# Patient Record
Sex: Female | Born: 1955
Health system: Southern US, Community
[De-identification: ages and names within clinical notes are randomized; demographics above are authoritative.]

---

## 1998-05-03 ENCOUNTER — Other Ambulatory Visit: Admission: RE | Admit: 1998-05-03 | Discharge: 1998-05-03 | Payer: Self-pay | Admitting: Obstetrics and Gynecology

## 1999-06-21 ENCOUNTER — Other Ambulatory Visit: Admission: RE | Admit: 1999-06-21 | Discharge: 1999-06-21 | Payer: Self-pay | Admitting: Obstetrics and Gynecology

## 1999-09-07 ENCOUNTER — Encounter: Payer: Self-pay | Admitting: Obstetrics and Gynecology

## 1999-09-07 ENCOUNTER — Encounter: Admission: RE | Admit: 1999-09-07 | Discharge: 1999-09-07 | Payer: Self-pay | Admitting: Obstetrics and Gynecology

## 2000-06-28 ENCOUNTER — Other Ambulatory Visit: Admission: RE | Admit: 2000-06-28 | Discharge: 2000-06-28 | Payer: Self-pay | Admitting: Obstetrics and Gynecology

## 2000-11-06 ENCOUNTER — Encounter: Payer: Self-pay | Admitting: Internal Medicine

## 2000-11-06 ENCOUNTER — Ambulatory Visit (HOSPITAL_COMMUNITY): Admission: RE | Admit: 2000-11-06 | Discharge: 2000-11-06 | Payer: Self-pay | Admitting: Internal Medicine

## 2001-09-18 ENCOUNTER — Other Ambulatory Visit: Admission: RE | Admit: 2001-09-18 | Discharge: 2001-09-18 | Payer: Self-pay | Admitting: Obstetrics and Gynecology

## 2001-10-02 ENCOUNTER — Encounter: Admission: RE | Admit: 2001-10-02 | Discharge: 2001-10-02 | Payer: Self-pay | Admitting: Obstetrics and Gynecology

## 2001-10-02 ENCOUNTER — Encounter: Payer: Self-pay | Admitting: Obstetrics and Gynecology

## 2002-11-11 ENCOUNTER — Other Ambulatory Visit: Admission: RE | Admit: 2002-11-11 | Discharge: 2002-11-11 | Payer: Self-pay | Admitting: Obstetrics and Gynecology

## 2003-03-03 ENCOUNTER — Encounter: Admission: RE | Admit: 2003-03-03 | Discharge: 2003-03-03 | Payer: Self-pay | Admitting: Obstetrics and Gynecology

## 2005-03-07 ENCOUNTER — Other Ambulatory Visit: Admission: RE | Admit: 2005-03-07 | Discharge: 2005-03-07 | Payer: Self-pay | Admitting: Internal Medicine

## 2005-03-15 ENCOUNTER — Encounter: Admission: RE | Admit: 2005-03-15 | Discharge: 2005-03-15 | Payer: Self-pay | Admitting: Internal Medicine

## 2005-11-01 ENCOUNTER — Encounter: Admission: RE | Admit: 2005-11-01 | Discharge: 2005-11-01 | Payer: Self-pay | Admitting: Internal Medicine

## 2006-05-15 ENCOUNTER — Other Ambulatory Visit: Admission: RE | Admit: 2006-05-15 | Discharge: 2006-05-15 | Payer: Self-pay | Admitting: Internal Medicine

## 2006-10-17 ENCOUNTER — Encounter: Admission: RE | Admit: 2006-10-17 | Discharge: 2006-10-17 | Payer: Self-pay | Admitting: Internal Medicine

## 2007-09-26 ENCOUNTER — Other Ambulatory Visit: Admission: RE | Admit: 2007-09-26 | Discharge: 2007-09-26 | Payer: Self-pay | Admitting: Internal Medicine

## 2007-12-17 ENCOUNTER — Ambulatory Visit: Payer: Self-pay | Admitting: Internal Medicine

## 2008-08-10 ENCOUNTER — Ambulatory Visit: Payer: Self-pay | Admitting: Internal Medicine

## 2009-03-22 ENCOUNTER — Ambulatory Visit: Payer: Self-pay | Admitting: Internal Medicine

## 2009-11-10 ENCOUNTER — Encounter: Admission: RE | Admit: 2009-11-10 | Discharge: 2009-11-10 | Payer: Self-pay | Admitting: Internal Medicine

## 2010-03-28 DIAGNOSIS — K529 Noninfective gastroenteritis and colitis, unspecified: Secondary | ICD-10-CM

## 2010-11-14 ENCOUNTER — Encounter: Payer: Self-pay | Admitting: Internal Medicine

## 2011-01-25 ENCOUNTER — Telehealth: Payer: Self-pay

## 2011-01-25 NOTE — Telephone Encounter (Signed)
Can see her tomorrow

## 2011-01-25 NOTE — Telephone Encounter (Signed)
Patient has a daughter in from college that has a breast lump, present for at least 3 months. Has no PCP or Gyn.Has not gone to student center. Wants advice.

## 2011-07-22 ENCOUNTER — Other Ambulatory Visit: Payer: Self-pay | Admitting: Internal Medicine

## 2011-08-15 ENCOUNTER — Ambulatory Visit
Admission: RE | Admit: 2011-08-15 | Discharge: 2011-08-15 | Disposition: A | Discharge status: Home / Self Care | Payer: BC Managed Care – PPO | Source: Ambulatory Visit | Attending: Internal Medicine | Admitting: Internal Medicine

## 2011-08-15 ENCOUNTER — Other Ambulatory Visit: Payer: Self-pay | Admitting: Internal Medicine

## 2011-08-15 DIAGNOSIS — Z1231 Encounter for screening mammogram for malignant neoplasm of breast: Secondary | ICD-10-CM

## 2012-10-11 ENCOUNTER — Other Ambulatory Visit: Payer: 59 | Admitting: Internal Medicine

## 2012-10-11 DIAGNOSIS — Z13220 Encounter for screening for lipoid disorders: Secondary | ICD-10-CM

## 2012-10-11 DIAGNOSIS — Z130 Encounter for screening for diseases of the blood and blood-forming organs and certain disorders involving the immune mechanism: Secondary | ICD-10-CM

## 2012-10-11 DIAGNOSIS — Z0000 Encounter for general adult medical examination without abnormal findings: Secondary | ICD-10-CM

## 2012-10-11 DIAGNOSIS — Z1329 Encounter for screening for other suspected endocrine disorder: Secondary | ICD-10-CM

## 2012-10-11 LAB — COMPREHENSIVE METABOLIC PANEL
ALT: 22 U/L (ref 0–35)
AST: 24 U/L (ref 0–37)
Albumin: 4.3 g/dL (ref 3.5–5.2)
Alkaline Phosphatase: 76 U/L (ref 39–117)
Calcium: 9.2 mg/dL (ref 8.4–10.5)
Creat: 0.75 mg/dL (ref 0.50–1.10)
Glucose, Bld: 83 mg/dL (ref 70–99)
Total Protein: 6.8 g/dL (ref 6.0–8.3)

## 2012-10-11 LAB — VITAMIN D 25 HYDROXY (VIT D DEFICIENCY, FRACTURES): Vit D, 25-Hydroxy: 58 ng/mL (ref 30–89)

## 2012-10-11 LAB — CBC WITH DIFFERENTIAL/PLATELET
HCT: 41.9 % (ref 36.0–46.0)
Hemoglobin: 14.4 g/dL (ref 12.0–15.0)
Lymphocytes Relative: 31 % (ref 12–46)
MCHC: 34.4 g/dL (ref 30.0–36.0)
Neutrophils Relative %: 57 % (ref 43–77)
Platelets: 237 10*3/uL (ref 150–400)
RBC: 4.16 MIL/uL (ref 3.87–5.11)
RDW: 13.8 % (ref 11.5–15.5)
WBC: 4.1 10*3/uL (ref 4.0–10.5)

## 2012-10-11 LAB — LIPID PANEL
HDL: 96 mg/dL (ref >39)
LDL Cholesterol: 94 mg/dL (ref 0–99)
Triglycerides: 72 mg/dL (ref <150)

## 2012-10-14 ENCOUNTER — Ambulatory Visit (INDEPENDENT_AMBULATORY_CARE_PROVIDER_SITE_OTHER): Payer: 59 | Admitting: Internal Medicine

## 2012-10-14 ENCOUNTER — Other Ambulatory Visit (HOSPITAL_COMMUNITY)
Admission: RE | Admit: 2012-10-14 | Discharge: 2012-10-14 | Disposition: A | Discharge status: Home / Self Care | Payer: 59 | Source: Ambulatory Visit | Attending: Internal Medicine | Admitting: Internal Medicine

## 2012-10-14 ENCOUNTER — Encounter: Payer: Self-pay | Admitting: Internal Medicine

## 2012-10-14 VITALS — BP 114/74 | HR 76 | Ht 65.5 in | Wt 138.0 lb

## 2012-10-14 DIAGNOSIS — Z0000 Encounter for general adult medical examination without abnormal findings: Secondary | ICD-10-CM

## 2012-10-14 DIAGNOSIS — Z8659 Personal history of other mental and behavioral disorders: Secondary | ICD-10-CM

## 2012-10-14 DIAGNOSIS — Z01419 Encounter for gynecological examination (general) (routine) without abnormal findings: Secondary | ICD-10-CM | POA: Insufficient documentation

## 2012-10-14 LAB — POCT URINALYSIS DIPSTICK
Bilirubin, UA: NEGATIVE
Ketones, UA: NEGATIVE
Leukocytes, UA: NEGATIVE
Nitrite, UA: NEGATIVE
Urobilinogen, UA: NEGATIVE
pH, UA: 6

## 2012-10-14 MED ORDER — CLONAZEPAM 1 MG PO TABS: 1.0000 mg | ORAL_TABLET | Freq: Every evening | ORAL | Status: DC | PRN

## 2012-10-14 MED ORDER — ESCITALOPRAM OXALATE 10 MG PO TABS: ORAL_TABLET | ORAL | Status: DC

## 2012-10-24 DIAGNOSIS — F32A Depression, unspecified: Secondary | ICD-10-CM | POA: Insufficient documentation

## 2012-10-24 DIAGNOSIS — F329 Major depressive disorder, single episode, unspecified: Secondary | ICD-10-CM | POA: Insufficient documentation

## 2012-10-24 NOTE — Patient Instructions (Addendum)
Continue Klonopin as needed for anxiety and continued daily Lexapro. Return in one year. Have mammogram.

## 2012-10-24 NOTE — Progress Notes (Signed)
  Subjective:    Patient ID: Kara Blake, female    DOB: [birth year], 57 y.o.   MRN: [id number]  HPI 57 year old white female not seen in office since 2011 in today for health maintenance and evaluation of medical issues.  No known drug allergies  History of depression 2007 treated with Lexapro. History of anxiety treated with Klonopin and subsequently Xanax.  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 Does not smoke. Social alcohol consumption. 3 daughters.  Family history: Father died at age 12 of complications of colorectal cancer. Mother died of COPD heart disease with history of heavy smoking. No brothers. One sister in good health.  Patient had colonoscopy 04/26/2006 and [facility] by Dr. Guadalupe Maple. A 1 cm sessile polyp was cold biopsied x6.  History of low back pain seen by [facility] March 2011 and felt to have musculoskeletal pain rather than radiculopathy.  Patient had EKG for valuation of chest pain in 2007 which was normal.    Review of Systems  Constitutional: Negative.   All other systems reviewed and are negative.       Objective:   Physical Exam  Vitals reviewed. Constitutional: She is oriented to person, place, and time. She appears well-developed and well-nourished. No distress.  HENT:  Head: Normocephalic and atraumatic.  Right Ear: External ear normal.  Left Ear: External ear normal.  Mouth/Throat: Oropharynx is clear and moist. No oropharyngeal exudate.  Eyes: Conjunctivae and EOM are normal. Pupils are equal, round, and reactive to light. Right eye exhibits no discharge. Left eye exhibits no discharge. No scleral icterus.  Neck: Neck supple. No JVD present. No thyromegaly present.  Cardiovascular: Normal rate, regular rhythm, normal heart sounds and intact distal pulses.   No murmur heard. Pulmonary/Chest: Effort normal and  breath sounds normal. No respiratory distress. She has no wheezes. She has no rales. She exhibits no tenderness.  Abdominal: Soft. Bowel sounds are normal. She exhibits no distension and no mass. There is no tenderness. There is no rebound and no guarding.  Genitourinary:  Pap taken- no masses on bimanual exam  Musculoskeletal: Normal range of motion. She exhibits no edema.  Lymphadenopathy:    She has no cervical adenopathy.  Neurological: She is oriented to person, place, and time. She has normal reflexes. No cranial nerve deficit. Coordination normal.  Skin: Skin is warm and dry. No rash noted. She is not diaphoretic.  Psychiatric: She has a normal mood and affect. Her behavior is normal. Judgment and thought content normal.          Assessment & Plan:  Normal health maintenance exam  History of anxiety and depression  Plan: Return in one year or as needed. Patient is to check with [facility] to see when next colonoscopy is due. Has history of colon cancer in first degree relative. Lab work is within normal limits including TSH and vitamin D level. Lipid panel normal. Patient is to have annual mammogram.

## 2013-02-14 ENCOUNTER — Emergency Department (HOSPITAL_COMMUNITY)
Admission: EM | Admit: 2013-02-14 | Discharge: 2013-02-14 | Disposition: A | Discharge status: Home / Self Care | Payer: 59 | Attending: Emergency Medicine | Admitting: Emergency Medicine

## 2013-02-14 ENCOUNTER — Encounter (HOSPITAL_COMMUNITY): Payer: Self-pay | Admitting: Emergency Medicine

## 2013-02-14 ENCOUNTER — Emergency Department (HOSPITAL_COMMUNITY): Payer: 59

## 2013-02-14 ENCOUNTER — Emergency Department (INDEPENDENT_AMBULATORY_CARE_PROVIDER_SITE_OTHER)
Admission: EM | Admit: 2013-02-14 | Discharge: 2013-02-14 | Disposition: A | Discharge status: Other Acute Inpatient Hospital | Payer: 59 | Source: Home / Self Care

## 2013-02-14 DIAGNOSIS — R079 Chest pain, unspecified: Secondary | ICD-10-CM | POA: Insufficient documentation

## 2013-02-14 DIAGNOSIS — R000 Tachycardia, unspecified: Secondary | ICD-10-CM

## 2013-02-14 DIAGNOSIS — R209 Unspecified disturbances of skin sensation: Secondary | ICD-10-CM | POA: Insufficient documentation

## 2013-02-14 DIAGNOSIS — R0600 Dyspnea, unspecified: Secondary | ICD-10-CM

## 2013-02-14 DIAGNOSIS — Z7982 Long term (current) use of aspirin: Secondary | ICD-10-CM | POA: Insufficient documentation

## 2013-02-14 DIAGNOSIS — R0989 Other specified symptoms and signs involving the circulatory and respiratory systems: Secondary | ICD-10-CM

## 2013-02-14 DIAGNOSIS — R0602 Shortness of breath: Secondary | ICD-10-CM | POA: Insufficient documentation

## 2013-02-14 DIAGNOSIS — R0609 Other forms of dyspnea: Secondary | ICD-10-CM

## 2013-02-14 DIAGNOSIS — Z79899 Other long term (current) drug therapy: Secondary | ICD-10-CM | POA: Insufficient documentation

## 2013-02-14 LAB — BASIC METABOLIC PANEL
BUN: 10 mg/dL (ref 6–23)
CALCIUM: 9.1 mg/dL (ref 8.4–10.5)
CO2: 21 meq/L (ref 19–32)
CREATININE: 0.58 mg/dL (ref 0.50–1.10)
Chloride: 104 mEq/L (ref 96–112)
Glucose, Bld: 109 mg/dL — ABNORMAL HIGH (ref 70–99)
Potassium: 4.8 mEq/L (ref 3.7–5.3)
SODIUM: 140 meq/L (ref 137–147)

## 2013-02-14 LAB — CBC WITH DIFFERENTIAL/PLATELET
Basophils Absolute: 0 10*3/uL (ref 0.0–0.1)
Basophils Relative: 0 % (ref 0–1)
EOS PCT: 1 % (ref 0–5)
Eosinophils Absolute: 0 10*3/uL (ref 0.0–0.7)
HEMATOCRIT: 39.2 % (ref 36.0–46.0)
Hemoglobin: 13.9 g/dL (ref 12.0–15.0)
Lymphocytes Relative: 25 % (ref 12–46)
Lymphs Abs: 1 10*3/uL (ref 0.7–4.0)
MCH: 35.4 pg — AB (ref 26.0–34.0)
MCHC: 35.5 g/dL (ref 30.0–36.0)
MCV: 99.7 fL (ref 78.0–100.0)
MONO ABS: 0.4 10*3/uL (ref 0.1–1.0)
MONOS PCT: 9 % (ref 3–12)
Neutro Abs: 2.6 10*3/uL (ref 1.7–7.7)
Neutrophils Relative %: 65 % (ref 43–77)
PLATELETS: 207 10*3/uL (ref 150–400)
RBC: 3.93 MIL/uL (ref 3.87–5.11)
RDW: 12.5 % (ref 11.5–15.5)
WBC: 4 10*3/uL (ref 4.0–10.5)

## 2013-02-14 LAB — TROPONIN I: Troponin I: <0.3 ng/mL (ref <0.30)

## 2013-02-14 LAB — D-DIMER, QUANTITATIVE (NOT AT ARMC)

## 2013-02-14 MED ORDER — SODIUM CHLORIDE 0.9 % IV SOLN
Freq: Once | INTRAVENOUS | Status: AC
  Administered 2013-02-14: 11:00:00 via INTRAVENOUS

## 2013-02-14 MED ORDER — ASPIRIN 81 MG PO CHEW
324.0000 mg | CHEWABLE_TABLET | Freq: Once | ORAL | Status: AC
  Administered 2013-02-14: 243 mg via ORAL
  Filled 2013-02-14: qty 4

## 2013-02-14 MED ORDER — NITROGLYCERIN 0.4 MG SL SUBL
SUBLINGUAL_TABLET | SUBLINGUAL | Status: AC
  Filled 2013-02-14: qty 25

## 2013-02-14 MED ORDER — NITROGLYCERIN 0.4 MG SL SUBL
0.4000 mg | SUBLINGUAL_TABLET | SUBLINGUAL | Status: DC | PRN
  Administered 2013-02-14: 0.4 mg via SUBLINGUAL

## 2013-02-14 NOTE — ED Notes (Signed)
CN advised of pending transfer, [facility] Called to provide trnsport

## 2013-02-14 NOTE — ED Notes (Signed)
Woke this AM w numbness left arm and hand, thought it was related to sleep position. While at work ,developed tightness in chest, dyspnea when went up steps. Pain not reproducible w inspiraton, direct pressure on chest. C/o feels as if she cannot get a good deep breath. C/o hands feel sweaty . Skin w/d/color good

## 2013-02-14 NOTE — ED Notes (Signed)
Blood collected by this RN for troponin.

## 2013-02-14 NOTE — ED Provider Notes (Signed)
CSN: [id number]     Arrival date & time 02/14/13  1141 History   First MD Initiated Contact with Patient 02/14/13 1143     No chief complaint on file.  (Consider location/radiation/quality/duration/timing/severity/associated sxs/prior Treatment) The history is provided by the patient.   Kara Blake is a 58 y.o. female who presents for evaluation of left arm numbness, dyspnea on exertion, and chest discomfort. She first noticed a sensation of left arm numbness upon awakening this morning. Later, she developed shortness of breath while walking up stairs in her home. After that, she is trying to a meeting when she noticed a sensation of discomfort in her left lower chest. The chest discomfort was very mild and persisted until she was treated with nitroglycerin, at the urgent care Center. The discomfort has not recurred. The shortness of breath is only with exertion. She continues to have a sensation of left arm numbness, but it has improved. She has never had each of the individual, or the constellation of, these symptoms previously. She is very healthy. She exercises almost daily, playing tennis and doing fitness walking. She drove to the urgent care office today, and was transferred here by [facility]. There are no other known modifying factors.  History reviewed. No pertinent past medical history. Past Surgical History  Procedure Laterality Date  . Cesarean section     History reviewed. No pertinent family history. History  Substance Use Topics  . Smoking status: Never Smoker   . Smokeless tobacco: Never Used  . Alcohol Use: 5.0 oz/week    10 drink(s) per week   OB History   Grav Para Term Preterm Abortions TAB SAB Ect Mult Living                 Review of Systems  All other systems reviewed and are negative.    Allergies  Review of patient's allergies indicates no known allergies.  Home Medications   Current Outpatient Rx  Name  Route  Sig  Dispense  Refill  . aspirin 81  MG tablet   Oral   Take 81 mg by mouth daily.         . Cholecalciferol (VITAMIN D PO)   Oral   Take 1 capsule by mouth daily.         . clonazePAM (KLONOPIN) 1 MG tablet   Oral   Take 1 tablet (1 mg total) by mouth at bedtime as needed for anxiety.   30 tablet   3   . Cyanocobalamin (VITAMIN B12 PO)   Oral   Take 1 capsule by mouth daily.         Marland Kitchen escitalopram (LEXAPRO) 10 MG tablet   Oral   Take 10 mg by mouth daily.          BP 127/75  Pulse 72  Temp(Src) 97.7 F (36.5 C) (Oral)  Resp 20  Ht 5\' 6"  (1.676 m)  Wt 135 lb (61.236 kg)  BMI 21.80 kg/m2  SpO2 98% Physical Exam  Nursing note and vitals reviewed. Constitutional: She is oriented to person, place, and time. She appears well-developed and well-nourished. No distress.  HENT:  Head: Normocephalic and atraumatic.  Eyes: Conjunctivae and EOM are normal. Pupils are equal, round, and reactive to light.  Neck: Normal range of motion and phonation normal. Neck supple.  Cardiovascular: Normal rate, regular rhythm and intact distal pulses.   Pulmonary/Chest: Effort normal and breath sounds normal. She exhibits no tenderness.  Abdominal: Soft. She exhibits no distension. There  is no tenderness. There is no guarding.  Musculoskeletal: Normal range of motion.  Neurological: She is alert and oriented to person, place, and time. She exhibits normal muscle tone.  Skin: Skin is warm and dry.  Psychiatric: She has a normal mood and affect. Her behavior is normal. Judgment and thought content normal.    ED Course  Procedures (including critical care time) Medications  aspirin chewable tablet 324 mg (243 mg Oral Given 02/14/13 1249)    Patient Vitals for the past 24 hrs:  BP Temp Temp src Pulse Resp SpO2 Height Weight  02/14/13 1552 127/75 mmHg - - 72 20 98 % - -  02/14/13 1445 119/90 mmHg - - 69 17 98 % - -  02/14/13 1429 115/61 mmHg - - 79 20 98 % - -  02/14/13 1351 117/60 mmHg - - 86 18 98 % - -  02/14/13  1251 119/65 mmHg - - 91 20 95 % - -  02/14/13 1237 115/70 mmHg - - 82 20 97 % - -  02/14/13 1148 110/73 mmHg 97.7 F (36.5 C) Oral 93 18 98 % 5\' 6"  (1.676 m) 135 lb (61.236 kg)    1:55 PM Reevaluation with update and discussion. After initial assessment and treatment, an updated evaluation reveals all symptoms resolved. No further complaints. Keghan Mcfarren L     Labs Review Labs Reviewed  CBC WITH DIFFERENTIAL - Abnormal; Notable for the following:    MCH 35.4 (*)    All other components within normal limits  BASIC METABOLIC PANEL - Abnormal; Notable for the following:    Glucose, Bld 109 (*)    All other components within normal limits  TROPONIN I  D-DIMER, QUANTITATIVE  TROPONIN I   Imaging Review Dg Chest 2 View  02/14/2013   CLINICAL DATA:  Left arm numbness, severe shortness of breath  EXAM: CHEST  2 VIEW  COMPARISON:  None.  FINDINGS: Normal cardiac silhouette and mediastinal contours. Suspected minimal amount of atherosclerotic plaque within the thoracic aorta. The lungs are hyperexpanded with mild diffuse slightly nodular thickening of the pulmonary interstitium. No focal airspace opacities. No pleural effusion or pneumothorax. No evidence of edema. No acute osseus abnormalities.  IMPRESSION: Hyperexpanded lungs and bronchitic change without acute cardiopulmonary disease.   Electronically Signed   By: Sandi Mariscal M.D.   On: 02/14/2013 15:25    EKG Interpretation    Date/Time:  Friday February 14 2013 11:48:50 EST Ventricular Rate:  78 PR Interval:  156 QRS Duration: 68 QT Interval:  369 QTC Calculation: 420 R Axis:   73 Text Interpretation:  Sinus rhythm Probable left atrial enlargement Anteroseptal infarct, old since last tracing no significant change Confirmed by Eulis Foster  MD, Anett Ranker ([id number]) on 02/14/2013 12:42:27 PM            MDM   1. Chest pain    Nonspecific chest pain, with reassuring evaluation. Negative delta troponin, and screening labs. Doubt ACS, PE,  or pneumonia. She is at low risk for cardiac disease.  Nursing Notes Reviewed/ Care Coordinated Applicable Imaging Reviewed Interpretation of Laboratory Data incorporated into ED treatment  The patient appears reasonably screened and/or stabilized for discharge and I doubt any other medical condition or other [facility] requiring further screening, evaluation, or treatment in the ED at this time prior to discharge.  Plan: Home Medications- usual; Home Treatments- rest; return here if the recommended treatment, does not improve the symptoms; Recommended follow up- PCP 1 week for stress test   Vira Agar  Adolph Pollack, MD 02/14/13 1620

## 2013-02-14 NOTE — ED Provider Notes (Signed)
CSN: [id number]     Arrival date & time 02/14/13  1006 History   First MD Initiated Contact with Patient 02/14/13 1042     Chief Complaint  Patient presents with  . Chest Pain   (Consider location/radiation/quality/duration/timing/severity/associated sxs/prior Treatment) HPI Comments: As above, this 58 year old female was at work this morning and experienced acute onset of tightness in the chest also described as a fullness or a bubble subsequently. When she began to climb steps she also experienced exertional dyspnea which is unusual for her. She developed diaphoresis and clamminess and felt as though she could not get a deep breath. On arrival she is awake and alert and in no acute distress. She rates her chest pain as a 4. She states she is in generally good health N. waltz about 2-1/2 miles nearly every day. Her only medication is aspirin 81 mg daily and Lexapro.    History reviewed. No pertinent past medical history. History reviewed. No pertinent past surgical history. No family history on file. History  Substance Use Topics  . Smoking status: Never Smoker   . Smokeless tobacco: Never Used  . Alcohol Use: 5.0 oz/week    10 drink(s) per week   OB History   Grav Para Term Preterm Abortions TAB SAB Ect Mult Living                 Review of Systems  Constitutional: Positive for activity change. Negative for fever.  HENT: Negative.   Respiratory: Positive for chest tightness and shortness of breath. Negative for cough and wheezing.   Cardiovascular: Positive for chest pain. Negative for leg swelling.  Gastrointestinal: Negative.   Genitourinary: Negative.   Skin: Negative for rash.  Psychiatric/Behavioral: The patient is nervous/anxious.     Allergies  Review of patient's allergies indicates no known allergies.  Home Medications   Current Outpatient Rx  Name  Route  Sig  Dispense  Refill  . aspirin 81 MG tablet   Oral   Take 81 mg by mouth daily.         .  clonazePAM (KLONOPIN) 1 MG tablet   Oral   Take 1 tablet (1 mg total) by mouth at bedtime as needed for anxiety.   30 tablet   3   . escitalopram (LEXAPRO) 10 MG tablet      TAKE 1 TABLET BY MOUTH EVERY DAY   90 tablet   PRN    BP 123/84  Pulse 109  Temp(Src) 97.4 F (36.3 C) (Oral)  Resp 20  SpO2 99% Physical Exam  Nursing note and vitals reviewed. Constitutional: She is oriented to person, place, and time. She appears well-developed and well-nourished. No distress.  Eyes: Conjunctivae and EOM are normal.  Neck: Normal range of motion. Neck supple.  Cardiovascular: Normal heart sounds.   tachycardia  Pulmonary/Chest: Effort normal and breath sounds normal. She has no wheezes.  Intermittent times of hyperventilation.  Abdominal: Soft. There is no tenderness.  Musculoskeletal: She exhibits no edema and no tenderness.  Neurological: She is alert and oriented to person, place, and time. She exhibits normal muscle tone.  Skin: Skin is warm and dry. No rash noted.  Psychiatric: She has a normal mood and affect.    ED Course  Procedures (including critical care time) Labs Review Labs Reviewed - No data to display Imaging Review No results found. EKG : sinus tachycardia. NSR/sinus arrythmia.   MDM   1. Chest pain   2. Dyspnea   3. Tachycardia  Transfer to [facility] ED for evaluation of acute chest pain and dyspnea that began suddenly this AM Improved chest discomfort post NTG x 1. From 4/10 to 1/10. BP did drop to 030 systolic and she was placed flat/supine.    Janne Napoleon, NP 02/14/13 1115  Janne Napoleon, NP 02/14/13 1115

## 2013-02-14 NOTE — ED Provider Notes (Signed)
Medical screening examination/treatment/procedure(s) were performed by resident physician or non-physician practitioner and as supervising physician I was immediately available for consultation/collaboration.   Pauline Good MD.   Billy Fischer, MD 02/14/13 [phone]

## 2013-02-14 NOTE — Discharge Instructions (Signed)
Chest Pain (Nonspecific) °It is often hard to give a specific diagnosis for the cause of chest pain. There is always a chance that your pain could be related to something serious, such as a heart attack or a blood clot in the lungs. You need to follow up with your caregiver for further evaluation. °CAUSES  °· Heartburn. °· Pneumonia or bronchitis. °· Anxiety or stress. °· Inflammation around your heart (pericarditis) or lung (pleuritis or pleurisy). °· A blood clot in the lung. °· A collapsed lung (pneumothorax). It can develop suddenly on its own (spontaneous pneumothorax) or from injury (trauma) to the chest. °· Shingles infection (herpes zoster virus). °The chest wall is composed of bones, muscles, and cartilage. Any of these can be the source of the pain. °· The bones can be bruised by injury. °· The muscles or cartilage can be strained by coughing or overwork. °· The cartilage can be affected by inflammation and become sore (costochondritis). °DIAGNOSIS  °Lab tests or other studies, such as X-rays, electrocardiography, stress testing, or cardiac imaging, may be needed to find the cause of your pain.  °TREATMENT  °· Treatment depends on what may be causing your chest pain. Treatment may include: °· Acid blockers for heartburn. °· Anti-inflammatory medicine. °· Pain medicine for inflammatory conditions. °· Antibiotics if an infection is present. °· You may be advised to change lifestyle habits. This includes stopping smoking and avoiding alcohol, caffeine, and chocolate. °· You may be advised to keep your head raised (elevated) when sleeping. This reduces the chance of acid going backward from your stomach into your esophagus. °· Most of the time, nonspecific chest pain will improve within 2 to 3 days with rest and mild pain medicine. °HOME CARE INSTRUCTIONS  °· If antibiotics were prescribed, take your antibiotics as directed. Finish them even if you start to feel better. °· For the next few days, avoid physical  activities that bring on chest pain. Continue physical activities as directed. °· Do not smoke. °· Avoid drinking alcohol. °· Only take over-the-counter or prescription medicine for pain, discomfort, or fever as directed by your caregiver. °· Follow your caregiver's suggestions for further testing if your chest pain does not go away. °· Keep any follow-up appointments you made. If you do not go to an appointment, you could develop lasting (chronic) problems with pain. If there is any problem keeping an appointment, you must call to reschedule. °SEEK MEDICAL CARE IF:  °· You think you are having problems from the medicine you are taking. Read your medicine instructions carefully. °· Your chest pain does not go away, even after treatment. °· You develop a rash with blisters on your chest. °SEEK IMMEDIATE MEDICAL CARE IF:  °· You have increased chest pain or pain that spreads to your arm, neck, jaw, back, or abdomen. °· You develop shortness of breath, an increasing cough, or you are coughing up blood. °· You have severe back or abdominal pain, feel nauseous, or vomit. °· You develop severe weakness, fainting, or chills. °· You have a fever. °THIS IS AN EMERGENCY. Do not wait to see if the pain will go away. Get medical help at once. Call your local emergency services (911 in [place] Do not drive yourself to the hospital. °MAKE SURE YOU:  °· Understand these instructions. °· Will watch your condition. °· Will get help right away if you are not doing well or get worse. °Document Released: 10/19/2004 Document Revised: 04/03/2011 Document Reviewed: 08/15/2007 °Exit[employer]® Patient Information ©2014 [employer]   [employer]. ° °

## 2013-02-14 NOTE — ED Notes (Signed)
Pt presents with onset of L arm numbness that she noted when she woke up this morning.  Pt reports shortness of breath with exertion.  Pt seen at Urgent Care, was given NTG that relieved sensation.  Pt describes chest discomfort as "indigestion".

## 2013-02-14 NOTE — ED Notes (Signed)
MD at bedside. 

## 2013-05-06 ENCOUNTER — Other Ambulatory Visit: Payer: Self-pay | Admitting: Internal Medicine

## 2013-12-15 ENCOUNTER — Other Ambulatory Visit: Payer: Self-pay | Admitting: Internal Medicine

## 2013-12-15 NOTE — Telephone Encounter (Signed)
Clonazepam called into [facility].

## 2013-12-15 NOTE — Telephone Encounter (Signed)
Please refill x 6 months 

## 2014-01-10 ENCOUNTER — Other Ambulatory Visit: Payer: Self-pay | Admitting: Internal Medicine

## 2015-02-07 ENCOUNTER — Other Ambulatory Visit: Payer: Self-pay | Admitting: Internal Medicine

## 2015-06-03 ENCOUNTER — Other Ambulatory Visit: Payer: 59 | Admitting: Internal Medicine

## 2015-06-03 DIAGNOSIS — Z0000 Encounter for general adult medical examination without abnormal findings: Secondary | ICD-10-CM

## 2015-06-03 DIAGNOSIS — Z1329 Encounter for screening for other suspected endocrine disorder: Secondary | ICD-10-CM

## 2015-06-03 DIAGNOSIS — Z1321 Encounter for screening for nutritional disorder: Secondary | ICD-10-CM

## 2015-06-03 DIAGNOSIS — Z130 Encounter for screening for diseases of the blood and blood-forming organs and certain disorders involving the immune mechanism: Secondary | ICD-10-CM

## 2015-06-03 DIAGNOSIS — Z13220 Encounter for screening for lipoid disorders: Secondary | ICD-10-CM

## 2015-06-03 LAB — COMPLETE METABOLIC PANEL WITH GFR
ALBUMIN: 4.5 g/dL (ref 3.6–5.1)
ALK PHOS: 82 U/L (ref 33–130)
ALT: 18 U/L (ref 6–29)
AST: 24 U/L (ref 10–35)
BUN: 11 mg/dL (ref 7–25)
CALCIUM: 9.4 mg/dL (ref 8.6–10.4)
CHLORIDE: 104 mmol/L (ref 98–110)
CO2: 26 mmol/L (ref 20–31)
Creat: 0.69 mg/dL (ref 0.50–0.99)
GFR, Est African American: >89 mL/min (ref >=60)
Glucose, Bld: 91 mg/dL (ref 65–99)
POTASSIUM: 4.8 mmol/L (ref 3.5–5.3)
Sodium: 139 mmol/L (ref 135–146)
Total Bilirubin: 1.3 mg/dL — ABNORMAL HIGH (ref 0.2–1.2)
Total Protein: 6.7 g/dL (ref 6.1–8.1)

## 2015-06-03 LAB — CBC WITH DIFFERENTIAL/PLATELET
BASOS ABS: 36 {cells}/uL (ref 0–200)
Basophils Relative: 1 %
Eosinophils Absolute: 180 cells/uL (ref 15–500)
Eosinophils Relative: 5 %
HEMATOCRIT: 42.5 % (ref 35.0–45.0)
HEMOGLOBIN: 14.6 g/dL (ref 11.7–15.5)
LYMPHS ABS: 1188 {cells}/uL (ref 850–3900)
Lymphocytes Relative: 33 %
MCH: 34.9 pg — AB (ref 27.0–33.0)
MCHC: 34.4 g/dL (ref 32.0–36.0)
MCV: 101.7 fL — ABNORMAL HIGH (ref 80.0–100.0)
MONO ABS: 288 {cells}/uL (ref 200–950)
MPV: 9.8 fL (ref 7.5–12.5)
Monocytes Relative: 8 %
NEUTROS PCT: 53 %
Neutro Abs: 1908 cells/uL (ref 1500–7800)
Platelets: 238 10*3/uL (ref 140–400)
RBC: 4.18 MIL/uL (ref 3.80–5.10)
RDW: 13.6 % (ref 11.0–15.0)
WBC: 3.6 10*3/uL — ABNORMAL LOW (ref 3.8–10.8)

## 2015-06-03 LAB — LIPID PANEL
CHOL/HDL RATIO: 2.2 ratio (ref <=5.0)
Cholesterol: 216 mg/dL — ABNORMAL HIGH (ref 125–200)
HDL: 98 mg/dL (ref >=46)
LDL Cholesterol: 107 mg/dL (ref <130)
TRIGLYCERIDES: 56 mg/dL (ref <150)
VLDL: 11 mg/dL (ref <30)

## 2015-06-03 LAB — TSH: TSH: 1.34 mIU/L

## 2015-06-04 ENCOUNTER — Other Ambulatory Visit (HOSPITAL_COMMUNITY)
Admission: RE | Admit: 2015-06-04 | Discharge: 2015-06-04 | Disposition: A | Discharge status: Home / Self Care | Payer: 59 | Source: Ambulatory Visit | Attending: Internal Medicine | Admitting: Internal Medicine

## 2015-06-04 ENCOUNTER — Ambulatory Visit (INDEPENDENT_AMBULATORY_CARE_PROVIDER_SITE_OTHER): Payer: 59 | Admitting: Internal Medicine

## 2015-06-04 ENCOUNTER — Encounter: Payer: Self-pay | Admitting: Internal Medicine

## 2015-06-04 VITALS — BP 126/78 | HR 88 | Temp 97.8°F | Resp 18 | Ht 66.0 in | Wt 145.0 lb

## 2015-06-04 DIAGNOSIS — Z01419 Encounter for gynecological examination (general) (routine) without abnormal findings: Secondary | ICD-10-CM | POA: Insufficient documentation

## 2015-06-04 DIAGNOSIS — Z0000 Encounter for general adult medical examination without abnormal findings: Secondary | ICD-10-CM | POA: Diagnosis not present

## 2015-06-04 DIAGNOSIS — Z23 Encounter for immunization: Secondary | ICD-10-CM | POA: Diagnosis not present

## 2015-06-04 DIAGNOSIS — Z124 Encounter for screening for malignant neoplasm of cervix: Secondary | ICD-10-CM | POA: Diagnosis not present

## 2015-06-04 LAB — POCT URINALYSIS DIPSTICK
BILIRUBIN UA: NEGATIVE
Blood, UA: NEGATIVE
Glucose, UA: NEGATIVE
KETONES UA: NEGATIVE
LEUKOCYTES UA: NEGATIVE
Nitrite, UA: NEGATIVE
PH UA: 7
Protein, UA: NEGATIVE
Spec Grav, UA: 1.025
Urobilinogen, UA: 0.2

## 2015-06-04 LAB — VITAMIN D 25 HYDROXY (VIT D DEFICIENCY, FRACTURES): Vit D, 25-Hydroxy: 40 ng/mL (ref 30–100)

## 2015-06-04 NOTE — Patient Instructions (Addendum)
It was a pleasure to see you today. Continue same generic Lexapro and return in one year. Tetanus immunization given today.

## 2015-06-09 LAB — CYTOLOGY - PAP

## 2015-06-21 NOTE — Progress Notes (Signed)
   Subjective:    Patient ID: Kara Blake, female    DOB: 1955/08/22, 60 y.o.   MRN: [id number]  HPI 60 year old Female in today for health maintenance exam and evaluation of medical issues. Last seen here in 2014.  No known drug allergies  History of depression 2007 treated with Lexapro. History of anxiety treated with Klonopin and subsequently changed to Xanax.  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 She does not smoke. Social alcohol consumption. 3 daughters.  Family history: Father died at age 46 of complications of colorectal cancer. Mother died of COPD and heart disease with history of heavy smoking. No brothers. One sister in good health.  Patient had colonoscopy 04/26/2006 in [facility] by gastroenterologist(Dr. Eduard Clos). A 1 cm sessile polyp was cold biopsied 6.  History of low back pain seen at [facility] March 2011 and felt to have musculoskeletal pain rather than radiculopathy.  Patient had EKG for evaluation of chest pain in 2007 which was normal.    Review of Systems  Constitutional: Negative.   All other systems reviewed and are negative.      Objective:   Physical Exam  Constitutional: She is oriented to person, place, and time. She appears well-developed and well-nourished. No distress.  HENT:  Head: Normocephalic and atraumatic.  Right Ear: External ear normal.  Left Ear: External ear normal.  Mouth/Throat: Oropharynx is clear and moist. No oropharyngeal exudate.  Eyes: Conjunctivae and EOM are normal. Pupils are equal, round, and reactive to light. Right eye exhibits no discharge. Left eye exhibits no discharge. No scleral icterus.  Neck: Neck supple. No JVD present. No thyromegaly present.  Cardiovascular: Normal rate, regular rhythm, normal heart sounds and intact distal pulses.   No murmur heard. Pulmonary/Chest: Effort  normal. No respiratory distress. She has no wheezes. She has no rales. She exhibits no tenderness.  Breasts normal female without masses  Abdominal: Soft. Bowel sounds are normal. She exhibits no distension and no mass. There is no tenderness. There is no rebound and no guarding.  Genitourinary:  Pap taken. Bimanual normal  Neurological: She is alert and oriented to person, place, and time. She has normal reflexes. No cranial nerve deficit. Coordination normal.  Skin: Skin is warm and dry. No rash noted. She is not diaphoretic.  Psychiatric: She has a normal mood and affect. Her behavior is normal. Judgment and thought content normal.  Vitals reviewed.         Assessment & Plan:  Normal health maintenance exam  History of anxiety-okay to refill generic Lexapro for one year  Routine health maintenance-tetanus immunization update given today. Pap smear taken with results pending.  Plan: Okay to refill generic Lexapro for one year. Return in one year or as needed. Total cholesterol 216 and previously was normal. Work on diet and exercise.

## 2015-11-16 ENCOUNTER — Other Ambulatory Visit: Payer: Self-pay | Admitting: Internal Medicine

## 2015-11-16 DIAGNOSIS — Z1231 Encounter for screening mammogram for malignant neoplasm of breast: Secondary | ICD-10-CM

## 2015-12-07 ENCOUNTER — Ambulatory Visit: Payer: 59

## 2016-01-06 ENCOUNTER — Ambulatory Visit
Admission: RE | Admit: 2016-01-06 | Discharge: 2016-01-06 | Disposition: A | Discharge status: Home / Self Care | Payer: 59 | Source: Ambulatory Visit | Attending: Internal Medicine | Admitting: Internal Medicine

## 2016-01-06 DIAGNOSIS — Z1231 Encounter for screening mammogram for malignant neoplasm of breast: Secondary | ICD-10-CM

## 2016-05-07 ENCOUNTER — Other Ambulatory Visit: Payer: Self-pay | Admitting: Internal Medicine

## 2016-08-19 ENCOUNTER — Other Ambulatory Visit: Payer: Self-pay | Admitting: Internal Medicine

## 2016-11-29 ENCOUNTER — Other Ambulatory Visit: Payer: Self-pay | Admitting: Internal Medicine

## 2016-11-29 NOTE — Telephone Encounter (Signed)
Please book CPE after May 12

## 2017-03-14 DIAGNOSIS — D122 Benign neoplasm of ascending colon: Secondary | ICD-10-CM | POA: Diagnosis not present

## 2017-03-14 DIAGNOSIS — D124 Benign neoplasm of descending colon: Secondary | ICD-10-CM | POA: Diagnosis not present

## 2017-03-14 DIAGNOSIS — D126 Benign neoplasm of colon, unspecified: Secondary | ICD-10-CM | POA: Diagnosis not present

## 2017-03-14 DIAGNOSIS — Z800 Family history of malignant neoplasm of digestive organs: Secondary | ICD-10-CM | POA: Diagnosis not present

## 2017-03-14 DIAGNOSIS — K648 Other hemorrhoids: Secondary | ICD-10-CM | POA: Diagnosis not present

## 2017-03-14 DIAGNOSIS — Z86010 Personal history of colonic polyps: Secondary | ICD-10-CM | POA: Diagnosis not present

## 2017-03-14 DIAGNOSIS — K5730 Diverticulosis of large intestine without perforation or abscess without bleeding: Secondary | ICD-10-CM | POA: Diagnosis not present

## 2017-06-19 DIAGNOSIS — M5417 Radiculopathy, lumbosacral region: Secondary | ICD-10-CM | POA: Diagnosis not present

## 2017-06-19 DIAGNOSIS — M7912 Myalgia of auxiliary muscles, head and neck: Secondary | ICD-10-CM | POA: Diagnosis not present

## 2017-06-19 DIAGNOSIS — M62830 Muscle spasm of back: Secondary | ICD-10-CM | POA: Diagnosis not present

## 2017-07-16 DIAGNOSIS — H2513 Age-related nuclear cataract, bilateral: Secondary | ICD-10-CM | POA: Diagnosis not present

## 2017-09-03 ENCOUNTER — Other Ambulatory Visit: Payer: Self-pay | Admitting: Internal Medicine

## 2018-03-07 ENCOUNTER — Other Ambulatory Visit: Payer: 59 | Admitting: Internal Medicine

## 2018-03-07 DIAGNOSIS — E785 Hyperlipidemia, unspecified: Secondary | ICD-10-CM

## 2018-03-07 DIAGNOSIS — Z1329 Encounter for screening for other suspected endocrine disorder: Secondary | ICD-10-CM

## 2018-03-07 DIAGNOSIS — Z0000 Encounter for general adult medical examination without abnormal findings: Secondary | ICD-10-CM | POA: Diagnosis not present

## 2018-03-07 DIAGNOSIS — Z13220 Encounter for screening for lipoid disorders: Secondary | ICD-10-CM

## 2018-03-08 LAB — COMPLETE METABOLIC PANEL WITH GFR
AG Ratio: 1.9 (calc) (ref 1.0–2.5)
ALKALINE PHOSPHATASE (APISO): 89 U/L (ref 37–153)
ALT: 24 U/L (ref 6–29)
AST: 26 U/L (ref 10–35)
Albumin: 4.5 g/dL (ref 3.6–5.1)
BUN: 11 mg/dL (ref 7–25)
CALCIUM: 9.6 mg/dL (ref 8.6–10.4)
CO2: 27 mmol/L (ref 20–32)
CREATININE: 0.75 mg/dL (ref 0.50–0.99)
Chloride: 105 mmol/L (ref 98–110)
GFR, EST NON AFRICAN AMERICAN: 85 mL/min/{1.73_m2} (ref >=60)
GFR, Est African American: 98 mL/min/{1.73_m2} (ref >=60)
GLUCOSE: 89 mg/dL (ref 65–99)
Globulin: 2.4 g/dL (calc) (ref 1.9–3.7)
Potassium: 5.1 mmol/L (ref 3.5–5.3)
SODIUM: 140 mmol/L (ref 135–146)
Total Bilirubin: 1.3 mg/dL — ABNORMAL HIGH (ref 0.2–1.2)
Total Protein: 6.9 g/dL (ref 6.1–8.1)

## 2018-03-08 LAB — CBC WITH DIFFERENTIAL/PLATELET
Absolute Monocytes: 401 cells/uL (ref 200–950)
BASOS ABS: 51 {cells}/uL (ref 0–200)
Basophils Relative: 1.5 %
EOS PCT: 3.8 %
Eosinophils Absolute: 129 cells/uL (ref 15–500)
HCT: 42.8 % (ref 35.0–45.0)
Hemoglobin: 14.9 g/dL (ref 11.7–15.5)
Lymphs Abs: 1200 cells/uL (ref 850–3900)
MCH: 35.2 pg — ABNORMAL HIGH (ref 27.0–33.0)
MCHC: 34.8 g/dL (ref 32.0–36.0)
MCV: 101.2 fL — ABNORMAL HIGH (ref 80.0–100.0)
MONOS PCT: 11.8 %
MPV: 9.9 fL (ref 7.5–12.5)
NEUTROS PCT: 47.6 %
Neutro Abs: 1618 cells/uL (ref 1500–7800)
PLATELETS: 266 10*3/uL (ref 140–400)
RBC: 4.23 10*6/uL (ref 3.80–5.10)
RDW: 12.4 % (ref 11.0–15.0)
TOTAL LYMPHOCYTE: 35.3 %
WBC: 3.4 10*3/uL — AB (ref 3.8–10.8)

## 2018-03-08 LAB — LIPID PANEL
CHOL/HDL RATIO: 2.3 (calc) (ref <5.0)
CHOLESTEROL: 240 mg/dL — AB (ref <200)
HDL: 105 mg/dL (ref >=50)
LDL Cholesterol (Calc): 121 mg/dL (calc) — ABNORMAL HIGH
NON-HDL CHOLESTEROL (CALC): 135 mg/dL — AB (ref <130)
TRIGLYCERIDES: 52 mg/dL (ref <150)

## 2018-03-08 LAB — TSH: TSH: 1.61 mIU/L (ref 0.40–4.50)

## 2018-03-08 LAB — VITAMIN D 25 HYDROXY (VIT D DEFICIENCY, FRACTURES): VIT D 25 HYDROXY: 27 ng/mL — AB (ref 30–100)

## 2018-03-11 ENCOUNTER — Ambulatory Visit (INDEPENDENT_AMBULATORY_CARE_PROVIDER_SITE_OTHER): Payer: 59 | Admitting: Internal Medicine

## 2018-03-11 ENCOUNTER — Other Ambulatory Visit (HOSPITAL_COMMUNITY)
Admission: RE | Admit: 2018-03-11 | Discharge: 2018-03-11 | Disposition: A | Discharge status: Home / Self Care | Payer: 59 | Source: Ambulatory Visit | Attending: Internal Medicine | Admitting: Internal Medicine

## 2018-03-11 ENCOUNTER — Encounter: Payer: Self-pay | Admitting: Internal Medicine

## 2018-03-11 VITALS — BP 140/90 | HR 101 | Ht 66.0 in | Wt 145.0 lb

## 2018-03-11 DIAGNOSIS — Z23 Encounter for immunization: Secondary | ICD-10-CM | POA: Diagnosis not present

## 2018-03-11 DIAGNOSIS — E559 Vitamin D deficiency, unspecified: Secondary | ICD-10-CM

## 2018-03-11 DIAGNOSIS — D7589 Other specified diseases of blood and blood-forming organs: Secondary | ICD-10-CM

## 2018-03-11 DIAGNOSIS — Z124 Encounter for screening for malignant neoplasm of cervix: Secondary | ICD-10-CM | POA: Insufficient documentation

## 2018-03-11 DIAGNOSIS — Z0000 Encounter for general adult medical examination without abnormal findings: Secondary | ICD-10-CM

## 2018-03-11 DIAGNOSIS — R5383 Other fatigue: Secondary | ICD-10-CM

## 2018-03-11 DIAGNOSIS — E7800 Pure hypercholesterolemia, unspecified: Secondary | ICD-10-CM

## 2018-03-11 DIAGNOSIS — R7989 Other specified abnormal findings of blood chemistry: Secondary | ICD-10-CM

## 2018-03-11 DIAGNOSIS — R8290 Unspecified abnormal findings in urine: Secondary | ICD-10-CM | POA: Diagnosis not present

## 2018-03-11 LAB — POCT URINALYSIS DIPSTICK
APPEARANCE: NEGATIVE
BILIRUBIN UA: NEGATIVE
Blood, UA: NEGATIVE
Glucose, UA: NEGATIVE
Ketones, UA: NEGATIVE
Nitrite, UA: NEGATIVE
Odor: NEGATIVE
Protein, UA: NEGATIVE
Spec Grav, UA: 1.01 (ref 1.010–1.025)
Urobilinogen, UA: 0.2 E.U./dL
pH, UA: 6.5 (ref 5.0–8.0)

## 2018-03-11 MED ORDER — BUPROPION HCL ER (XL) 150 MG PO TB24: 150.0000 mg | ORAL_TABLET | Freq: Every day | ORAL | 0 refills | Status: DC

## 2018-03-11 NOTE — Progress Notes (Signed)
Subjective:    Patient ID: Kara Blake, female    DOB: 08/03/1955, 63 y.o.   MRN: [id number]  HPI Pleasant 63 year old Female in today for health maintenance exam and evaluation of medical issues.  Last seen in 2017 for health maintenance exam.  Had colonoscopy in [facility] February 2019.  No recent mammogram on file.  Reminded and order placed.  Labs reviewed.  TSH is normal.  Vitamin D level is low at 27 and needs to take 2000 units vitamin D3 daily.  She has significant elevation of total cholesterol at 240 but her HDL is 105 and her LDL is 121.  She does not want to be on statin therapy.  She has an elevated MCV of 101.2.  We checked B12 and folate levels and these were normal.  She has abnormal urine dipstick but culture was negative.  She has some fatigue but has no evidence of iron deficiency.  TSH is normal.  Had breast augmentation in [place] in the 1980s.  C-section x3.  History of depression 2007 treated with Lexapro.  History of anxiety treated with Klonopin and then changed to Xanax.  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  She does not smoke.  Social alcohol consumption.  3 daughters.  Family history: Father died at age 55 of complications of colorectal cancer.  Mother died of COPD and heart disease with history of heavy smoking.  No brothers.  One sister in good health.  Used to take Lexapro for anxiety and depression but she weaned herself off of that.  Still has some fatigue.  Is going to try Wellbutrin 150 mg XL daily.  Review of Systems  Constitutional: Positive for fatigue.  Respiratory: Negative.   Cardiovascular: Negative.   Gastrointestinal: Negative.   Genitourinary: Negative.   All other systems reviewed and are negative.      Objective:   Physical Exam Vitals signs reviewed.  Constitutional:      General: She is not in acute distress.    Appearance: Normal appearance.  HENT:     Head: Normocephalic  and atraumatic.     Right Ear: Tympanic membrane normal.     Left Ear: Tympanic membrane normal.     Nose: Nose normal.     Mouth/Throat:     Mouth: Mucous membranes are moist.     Pharynx: Oropharynx is clear.  Eyes:     General: No scleral icterus.       Right eye: No discharge.        Left eye: No discharge.     Extraocular Movements: Extraocular movements intact.     Conjunctiva/sclera: Conjunctivae normal.     Pupils: Pupils are equal, round, and reactive to light.  Neck:     Musculoskeletal: Neck supple. No neck rigidity.     Vascular: No carotid bruit.  Cardiovascular:     Rate and Rhythm: Normal rate and regular rhythm.     Pulses: Normal pulses.     Heart sounds: Normal heart sounds. No murmur.  Pulmonary:     Effort: Pulmonary effort is normal.     Breath sounds: Normal breath sounds. No wheezing or rales.  Abdominal:     General: Bowel sounds are normal.     Palpations: Abdomen is soft.     Tenderness: There is no abdominal tenderness. There is no guarding or rebound.  Genitourinary:    Comments: Pap taken.  Bimanual normal. Musculoskeletal:  Right lower leg: No edema.     Left lower leg: No edema.  Lymphadenopathy:     Cervical: No cervical adenopathy.  Skin:    General: Skin is warm and dry.     Findings: No rash.  Neurological:     General: No focal deficit present.     Mental Status: She is alert and oriented to person, place, and time.  Psychiatric:        Mood and Affect: Mood normal.        Behavior: Behavior normal.        Thought Content: Thought content normal.        Judgment: Judgment normal.           Assessment & Plan:  Normal health maintenance exam  Vitamin D deficiency-take 2000 units vitamin D3 daily  Fatigue-work-up is negative for vitamin deficiency and hypothyroidism.  Try Wellbutrin XL 150 mg daily.  No longer on Lexapro.  Flu vaccine given.  Please have mammogram.

## 2018-03-12 LAB — IRON,TIBC AND FERRITIN PANEL
%SAT: 36 % (calc) (ref 16–45)
Ferritin: 76 ng/mL (ref 16–288)
Iron: 130 ug/dL (ref 45–160)
TIBC: 363 ug/dL (ref 250–450)

## 2018-03-12 LAB — URINE CULTURE
MICRO NUMBER:: 203779
SPECIMEN QUALITY:: ADEQUATE

## 2018-03-12 LAB — VITAMIN B12: Vitamin B-12: 452 pg/mL (ref 200–1100)

## 2018-03-12 LAB — FOLATE: Folate: 8.7 ng/mL

## 2018-03-13 LAB — CYTOLOGY - PAP
ADEQUACY: ABSENT
DIAGNOSIS: NEGATIVE
HPV: NOT DETECTED

## 2018-03-20 DIAGNOSIS — L249 Irritant contact dermatitis, unspecified cause: Secondary | ICD-10-CM | POA: Diagnosis not present

## 2018-03-23 NOTE — Patient Instructions (Addendum)
It was a pleasure to see you today.  Watch diet.  Recommend repeat lipid panel in 6 months.  Please have mammogram.  Trial of Wellbutrin for fatigue.  Lexapro has been discontinued.

## 2018-04-22 ENCOUNTER — Other Ambulatory Visit: Payer: Self-pay | Admitting: Internal Medicine

## 2018-04-22 DIAGNOSIS — Z1231 Encounter for screening mammogram for malignant neoplasm of breast: Secondary | ICD-10-CM

## 2018-06-18 ENCOUNTER — Other Ambulatory Visit: Payer: Self-pay

## 2018-06-18 MED ORDER — BUPROPION HCL ER (XL) 150 MG PO TB24: 150.0000 mg | ORAL_TABLET | Freq: Every day | ORAL | 0 refills | Status: DC

## 2018-06-24 ENCOUNTER — Ambulatory Visit: Payer: 59

## 2018-08-02 ENCOUNTER — Ambulatory Visit: Payer: 59

## 2018-08-05 ENCOUNTER — Ambulatory Visit
Admission: RE | Admit: 2018-08-05 | Discharge: 2018-08-05 | Disposition: A | Discharge status: Home / Self Care | Payer: 59 | Source: Ambulatory Visit | Attending: Internal Medicine | Admitting: Internal Medicine

## 2018-08-05 ENCOUNTER — Other Ambulatory Visit: Payer: Self-pay

## 2018-09-10 ENCOUNTER — Other Ambulatory Visit: Payer: 59 | Admitting: Internal Medicine

## 2018-09-12 ENCOUNTER — Ambulatory Visit: Payer: 59 | Admitting: Internal Medicine

## 2018-09-16 ENCOUNTER — Other Ambulatory Visit: Payer: Self-pay | Admitting: Internal Medicine

## 2018-09-16 MED ORDER — BUPROPION HCL ER (XL) 150 MG PO TB24: 150.0000 mg | ORAL_TABLET | Freq: Every day | ORAL | 0 refills | Status: DC

## 2018-09-16 NOTE — Telephone Encounter (Signed)
Received Fax RX request from  [facility]  Medication - buPROPion (WELLBUTRIN XL) 150 MG 24 hr tablet   Last Refill - 06/18/18  Last OV - 03/11/18  Last CPE - 03/11/18

## 2018-09-17 ENCOUNTER — Other Ambulatory Visit: Payer: Self-pay

## 2018-09-17 ENCOUNTER — Other Ambulatory Visit: Payer: 59 | Admitting: Internal Medicine

## 2018-09-17 DIAGNOSIS — E785 Hyperlipidemia, unspecified: Secondary | ICD-10-CM

## 2018-09-17 LAB — LIPID PANEL
Cholesterol: 229 mg/dL — ABNORMAL HIGH (ref <200)
HDL: 101 mg/dL (ref >=50)
LDL Cholesterol (Calc): 114 mg/dL (calc) — ABNORMAL HIGH
Non-HDL Cholesterol (Calc): 128 mg/dL (calc) (ref <130)
Total CHOL/HDL Ratio: 2.3 (calc) (ref <5.0)
Triglycerides: 54 mg/dL (ref <150)

## 2018-09-19 ENCOUNTER — Encounter: Payer: Self-pay | Admitting: Internal Medicine

## 2018-09-19 ENCOUNTER — Other Ambulatory Visit: Payer: Self-pay

## 2018-09-19 ENCOUNTER — Ambulatory Visit: Payer: 59 | Admitting: Internal Medicine

## 2018-09-19 VITALS — BP 120/80 | HR 87 | Temp 98.2°F | Ht 66.0 in | Wt 139.0 lb

## 2018-09-19 DIAGNOSIS — Z780 Asymptomatic menopausal state: Secondary | ICD-10-CM | POA: Diagnosis not present

## 2018-09-19 DIAGNOSIS — E7800 Pure hypercholesterolemia, unspecified: Secondary | ICD-10-CM | POA: Diagnosis not present

## 2018-09-19 DIAGNOSIS — Z23 Encounter for immunization: Secondary | ICD-10-CM

## 2018-09-19 DIAGNOSIS — F5102 Adjustment insomnia: Secondary | ICD-10-CM

## 2018-09-19 MED ORDER — BUPROPION HCL ER (XL) 150 MG PO TB24: 150.0000 mg | ORAL_TABLET | Freq: Every day | ORAL | 3 refills | Status: DC

## 2018-09-19 NOTE — Progress Notes (Signed)
   Subjective:    Patient ID: Kara Blake, female    DOB: [birth year], 63 y.o.   MRN: [id number]  HPI 63 year old married Female sold her house in [place] in early August and is now living in an apartment downtown as well as spending time at their condominium in [place].  Getting along well during the pandemic.  Advise regarding flu vaccine.  Has been having some issues sleeping.  Thinks this is related to her recent move.  She took some of her husband's lorazepam she does not know the strength of the dose and slept well.  She would like to have her own prescription but needs to let me know what dose he is taking so we can give her a lower dose.  History of pure hypercholesterolemia.  In February total cholesterol was 240 with an LDL cholesterol of 121.  Follow-up recently total cholesterol 229 with LDL of 114.  HDL is 101.  Triglycerides 54.  We discussed taking low-dose statin therapy 3 times a week and she will think about this.  I suggested that we repeat lipid panel in 6 months when it is time for her health maintenance exam once again and make a decision at that time.  She is agreeable to this.  Flu vaccine given in office today.      Review of Systems see above     Objective:   Physical Exam  Blood pressure 120/80 pulse 87 temperature 98.2 degrees orally pulse oximetry 98%.  BMI 22.44.  Weight 139 pounds.  She is in no acute distress.      Assessment & Plan:  Pure hypercholesterolemia  Plan: She will be reevaluated in 6 months.  At that time if cholesterol is persistently elevated we will consider low-dose statin medication 3 times a week.  She is agreeable to this.  Flu vaccine given today.  Bone density study ordered.

## 2018-09-19 NOTE — Patient Instructions (Signed)
Continue diet and exercise efforts.  We will reevaluate cholesterol in 6 months and consider low-dose statin therapy if still elevated at that time.  Flu vaccine given today.  Please call us with the name of your husband's insomnia medication and we will give an appropriate prescription for you to use sparingly.  Physical exam to be done in 6 months.

## 2018-09-20 ENCOUNTER — Telehealth: Payer: Self-pay | Admitting: Internal Medicine

## 2018-09-20 MED ORDER — LORAZEPAM 1 MG PO TABS: ORAL_TABLET | ORAL | 1 refills | Status: DC

## 2018-09-20 NOTE — Telephone Encounter (Signed)
LVM that medication has been called in.

## 2018-09-20 NOTE — Telephone Encounter (Signed)
Maryke called back to say that the medication her husband is on is lorazepam 1mg .

## 2018-09-20 NOTE — Addendum Note (Signed)
Addended by: Elby Showers on: 09/20/2018 01:14 PM   Modules accepted: Orders

## 2018-09-20 NOTE — Telephone Encounter (Signed)
I have sent in Rx for her.

## 2018-12-17 ENCOUNTER — Telehealth: Payer: Self-pay | Admitting: Internal Medicine

## 2018-12-17 MED ORDER — BUPROPION HCL ER (XL) 150 MG PO TB24: 150.0000 mg | ORAL_TABLET | Freq: Every day | ORAL | 0 refills | Status: DC

## 2018-12-17 NOTE — Telephone Encounter (Signed)
Received Fax RX request from  [facility], [address] AT [address] [phone] (Phone) [phone] (Fax)     Medication -  buPROPion (WELLBUTRIN XL) 150 MG 24 hr tablet  Last Refill - 09/16/18  Last OV - 09/19/18  Last CPE - 03/11/18  Next CPE Appointment - 03/20/19

## 2019-01-02 ENCOUNTER — Other Ambulatory Visit: Payer: Self-pay | Admitting: Internal Medicine

## 2019-01-02 ENCOUNTER — Telehealth: Payer: Self-pay | Admitting: Internal Medicine

## 2019-01-02 NOTE — Telephone Encounter (Signed)
LVM to Call office to schedule CPE appointment

## 2019-01-02 NOTE — Telephone Encounter (Signed)
Needs to make CPE appt before we can refill

## 2019-01-02 NOTE — Telephone Encounter (Signed)
Received Fax RX request from  [facility], [address] AT [facility] [address] Phone:  [phone]  Fax:  [phone]      Medication - LORazepam (ATIVAN) 1 MG tablet    Last Refill - 11/09/18  Last OV - 09/19/18  Last CPE - 03/11/18  Next Appointment -

## 2019-01-02 NOTE — Telephone Encounter (Signed)
LVM to call back and schedule CPE and Labs due after 03/12/2019

## 2019-01-03 MED ORDER — LORAZEPAM 1 MG PO TABS: ORAL_TABLET | ORAL | 1 refills | Status: DC

## 2019-01-03 NOTE — Telephone Encounter (Signed)
CPE and Labs are scheduled

## 2019-01-03 NOTE — Telephone Encounter (Signed)
rx pended

## 2019-01-03 NOTE — Addendum Note (Signed)
Addended by: Mady Haagensen on: 01/03/2019 04:40 PM   Modules accepted: Orders

## 2019-01-10 NOTE — Telephone Encounter (Signed)
scheduled

## 2019-03-18 ENCOUNTER — Other Ambulatory Visit: Payer: Self-pay

## 2019-03-18 ENCOUNTER — Other Ambulatory Visit: Payer: No Typology Code available for payment source | Admitting: Internal Medicine

## 2019-03-18 DIAGNOSIS — R5383 Other fatigue: Secondary | ICD-10-CM

## 2019-03-18 DIAGNOSIS — Z0000 Encounter for general adult medical examination without abnormal findings: Secondary | ICD-10-CM

## 2019-03-18 DIAGNOSIS — E559 Vitamin D deficiency, unspecified: Secondary | ICD-10-CM

## 2019-03-18 DIAGNOSIS — Z1329 Encounter for screening for other suspected endocrine disorder: Secondary | ICD-10-CM

## 2019-03-18 DIAGNOSIS — Z780 Asymptomatic menopausal state: Secondary | ICD-10-CM

## 2019-03-18 DIAGNOSIS — E7800 Pure hypercholesterolemia, unspecified: Secondary | ICD-10-CM

## 2019-03-20 ENCOUNTER — Ambulatory Visit (INDEPENDENT_AMBULATORY_CARE_PROVIDER_SITE_OTHER): Payer: No Typology Code available for payment source | Admitting: Internal Medicine

## 2019-03-20 ENCOUNTER — Other Ambulatory Visit: Payer: Self-pay

## 2019-03-20 ENCOUNTER — Encounter: Payer: Self-pay | Admitting: Internal Medicine

## 2019-03-20 VITALS — BP 140/90 | HR 108 | Temp 98.1°F | Ht 65.0 in | Wt 137.0 lb

## 2019-03-20 DIAGNOSIS — E559 Vitamin D deficiency, unspecified: Secondary | ICD-10-CM | POA: Diagnosis not present

## 2019-03-20 DIAGNOSIS — F5102 Adjustment insomnia: Secondary | ICD-10-CM

## 2019-03-20 DIAGNOSIS — Z0000 Encounter for general adult medical examination without abnormal findings: Secondary | ICD-10-CM

## 2019-03-20 DIAGNOSIS — R7989 Other specified abnormal findings of blood chemistry: Secondary | ICD-10-CM

## 2019-03-20 DIAGNOSIS — Z780 Asymptomatic menopausal state: Secondary | ICD-10-CM

## 2019-03-20 DIAGNOSIS — R718 Other abnormality of red blood cells: Secondary | ICD-10-CM | POA: Diagnosis not present

## 2019-03-20 LAB — CBC WITH DIFFERENTIAL/PLATELET
Absolute Monocytes: 438 cells/uL (ref 200–950)
Basophils Absolute: 49 cells/uL (ref 0–200)
Basophils Relative: 1.4 %
Eosinophils Absolute: 81 cells/uL (ref 15–500)
Eosinophils Relative: 2.3 %
HCT: 41.9 % (ref 35.0–45.0)
Hemoglobin: 14.6 g/dL (ref 11.7–15.5)
Lymphs Abs: 1096 cells/uL (ref 850–3900)
MCH: 35.4 pg — ABNORMAL HIGH (ref 27.0–33.0)
MCHC: 34.8 g/dL (ref 32.0–36.0)
MCV: 101.7 fL — ABNORMAL HIGH (ref 80.0–100.0)
MPV: 9.8 fL (ref 7.5–12.5)
Monocytes Relative: 12.5 %
Neutro Abs: 1838 cells/uL (ref 1500–7800)
Neutrophils Relative %: 52.5 %
Platelets: 241 10*3/uL (ref 140–400)
RBC: 4.12 10*6/uL (ref 3.80–5.10)
RDW: 12.4 % (ref 11.0–15.0)
Total Lymphocyte: 31.3 %
WBC: 3.5 10*3/uL — ABNORMAL LOW (ref 3.8–10.8)

## 2019-03-20 LAB — TEST AUTHORIZATION

## 2019-03-20 LAB — POCT URINALYSIS DIPSTICK
Appearance: NEGATIVE
Bilirubin, UA: NEGATIVE
Blood, UA: NEGATIVE
Glucose, UA: NEGATIVE
Ketones, UA: NEGATIVE
Leukocytes, UA: NEGATIVE
Nitrite, UA: NEGATIVE
Odor: NEGATIVE
Protein, UA: NEGATIVE
Spec Grav, UA: 1.01 (ref 1.010–1.025)
Urobilinogen, UA: 0.2 E.U./dL
pH, UA: 6.5 (ref 5.0–8.0)

## 2019-03-20 LAB — COMPLETE METABOLIC PANEL WITH GFR
AG Ratio: 1.8 (calc) (ref 1.0–2.5)
ALT: 26 U/L (ref 6–29)
AST: 27 U/L (ref 10–35)
Albumin: 4.6 g/dL (ref 3.6–5.1)
Alkaline phosphatase (APISO): 80 U/L (ref 37–153)
BUN: 9 mg/dL (ref 7–25)
CO2: 27 mmol/L (ref 20–32)
Calcium: 9.6 mg/dL (ref 8.6–10.4)
Chloride: 101 mmol/L (ref 98–110)
Creat: 0.72 mg/dL (ref 0.50–0.99)
GFR, Est African American: 103 mL/min/{1.73_m2} (ref >=60)
GFR, Est Non African American: 88 mL/min/{1.73_m2} (ref >=60)
Globulin: 2.5 g/dL (calc) (ref 1.9–3.7)
Glucose, Bld: 107 mg/dL — ABNORMAL HIGH (ref 65–99)
Potassium: 4.6 mmol/L (ref 3.5–5.3)
Sodium: 138 mmol/L (ref 135–146)
Total Bilirubin: 1 mg/dL (ref 0.2–1.2)
Total Protein: 7.1 g/dL (ref 6.1–8.1)

## 2019-03-20 LAB — FOLATE: Folate: 14.6 ng/mL

## 2019-03-20 LAB — LIPID PANEL
Cholesterol: 235 mg/dL — ABNORMAL HIGH (ref <200)
HDL: 112 mg/dL (ref >=50)
LDL Cholesterol (Calc): 109 mg/dL (calc) — ABNORMAL HIGH
Non-HDL Cholesterol (Calc): 123 mg/dL (calc) (ref <130)
Total CHOL/HDL Ratio: 2.1 (calc) (ref <5.0)
Triglycerides: 55 mg/dL (ref <150)

## 2019-03-20 LAB — VITAMIN B12: Vitamin B-12: 1110 pg/mL — ABNORMAL HIGH (ref 200–1100)

## 2019-03-20 LAB — VITAMIN D 25 HYDROXY (VIT D DEFICIENCY, FRACTURES): Vit D, 25-Hydroxy: 26 ng/mL — ABNORMAL LOW (ref 30–100)

## 2019-03-20 LAB — TSH: TSH: 1.78 mIU/L (ref 0.40–4.50)

## 2019-03-20 MED ORDER — ERGOCALCIFEROL 1.25 MG (50000 UT) PO CAPS: 50000.0000 [IU] | ORAL_CAPSULE | ORAL | 0 refills | Status: DC

## 2019-03-20 NOTE — Progress Notes (Signed)
   Subjective:    Patient ID: Kara Blake, female    DOB: 1955/02/02, 64 y.o.   MRN: [id number]  HPI 64 year old Female for health maintenance for health maintenance exam and evaluation of medical issues.  Having some lip issues being treated with fluorouracil for precancerous areas that are solar damage related.  Living in [place], [place] but still has apartment here and visits from time to time. Walks alot. Has been healthy.  Labs reviewed.  Has vitamin D deficiency.  Level was 26.  Recommend high-dose vitamin D for 3 months then 5000 units daily.  She is not good about taking over-the-counter supplement recently.  Does get a fair amount of sunshine when she is outside.  Has high HDL cholesterol of 112.  LDL is very slightly elevated at 109.  Total was 235.  High HDL max total with higher.  She was reassured.  White blood cell count stable at 3500.  MCV is 101.7.  B12 and folate will be checked.  This has been previously evaluated) to be normal.  TSH is normal.  That shows fasting glucose of 107 which is okay.  History of depression 2007 treated with Lexapro.  History of anxiety treated with Klonopin and then changed to Xanax.  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  She does not smoke.  Social alcohol consumption.  3 daughters 1 of whom got married this past summer.  Family history: Father died at age 11 of complications of colorectal cancer.  Mother died of COPD and heart disease with history of heavy smoking.  No brothers.  1 sister in good health.  She used to take Lexapro for anxiety and depression but she weaned herself off of that.  She is on Wellbutrin at present time.  Takes Ativan 1 mg 1/2 to 1 tablet at bedtime as needed for sleep.  She does take oral over-the-counter B12 supplement.  Had colonoscopy in 2016.  Had mammogram July 2020.  Had tetanus immunization May 2017.              Review of Systems no new  complaints     Objective:   Physical Exam Blood pressure 140/90, pulse 108 the patient is slightly anxious.  She will continue to monitor her blood pressure at home.  BMI 22.80.  Skin warm and dry.  Nodes none.  Neck is supple without JVD thyromegaly or carotid bruits.  Chest is clear to auscultation without rales or wheezing.  Cardiac exam regular rate and rhythm normal S1 and S2.  Breast without masses.  Abdomen soft nondistended without hepatosplenomegaly masses or tenderness.  Bimanual is normal.  No lower extremity edema or deformity.  Neuro intact without focal deficits.  Lips are erythematous from treatment with fluorouracil.       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  Adjustment insomnia continue lorazepam  History of depression treated with Wellbutrin  Elevated MCV-B12 and folate have been checked before and were normal.  Need to be checked again.  High HDL which is protective against heart disease and is likely inherited but she also walks a lot.  Plan: Take COVID-19 vaccine when available.  Continue current medications and follow-up in 1 year or as needed.

## 2019-03-20 NOTE — Patient Instructions (Addendum)
It was a pleasure to see you today.  Continue current medications for insomnia and depression.  Take 50,000units weekly for 12 weeks then 5000 units daily over-the-counter.  Follow-up in 1 year or as needed.  Have COVID-19 vaccine when available.

## 2019-06-30 ENCOUNTER — Telehealth: Payer: Self-pay | Admitting: Internal Medicine

## 2019-06-30 NOTE — Telephone Encounter (Signed)
Left detailed message.   

## 2019-06-30 NOTE — Telephone Encounter (Signed)
Received Fax RX request from  [facility] AT [address] Phone:  [phone]  Fax:  [phone]       Medication - ergocalciferol (DRISDOL) 1.25 MG (50000 UT) capsule   Last Refill - 06/19/19  Last OV -  03/20/19  Last CPE - 03/20/19  Next Appointment - 09/19/19

## 2019-06-30 NOTE — Telephone Encounter (Signed)
She was supposed to take this for 3 months then take 5000 units daily over the counter. Please call her and also book  next CPE

## 2019-08-21 ENCOUNTER — Telehealth: Payer: Self-pay | Admitting: Internal Medicine

## 2019-08-21 NOTE — Telephone Encounter (Signed)
LVM to CB and reschedule lab appointment, for 09/18/2019

## 2019-09-18 ENCOUNTER — Other Ambulatory Visit: Payer: No Typology Code available for payment source | Admitting: Internal Medicine

## 2019-09-18 ENCOUNTER — Other Ambulatory Visit: Payer: Self-pay

## 2019-09-18 DIAGNOSIS — E7800 Pure hypercholesterolemia, unspecified: Secondary | ICD-10-CM

## 2019-09-18 LAB — LIPID PANEL
Cholesterol: 263 mg/dL — ABNORMAL HIGH (ref <200)
HDL: 105 mg/dL (ref >=50)
LDL Cholesterol (Calc): 144 mg/dL (calc) — ABNORMAL HIGH
Non-HDL Cholesterol (Calc): 158 mg/dL (calc) — ABNORMAL HIGH (ref <130)
Total CHOL/HDL Ratio: 2.5 (calc) (ref <5.0)
Triglycerides: 58 mg/dL (ref <150)

## 2019-09-19 ENCOUNTER — Encounter: Payer: Self-pay | Admitting: Internal Medicine

## 2019-09-19 ENCOUNTER — Ambulatory Visit (INDEPENDENT_AMBULATORY_CARE_PROVIDER_SITE_OTHER): Payer: No Typology Code available for payment source | Admitting: Internal Medicine

## 2019-09-19 VITALS — BP 140/80 | HR 98 | Ht 65.0 in | Wt 137.0 lb

## 2019-09-19 DIAGNOSIS — F5102 Adjustment insomnia: Secondary | ICD-10-CM | POA: Diagnosis not present

## 2019-09-19 DIAGNOSIS — E559 Vitamin D deficiency, unspecified: Secondary | ICD-10-CM | POA: Diagnosis not present

## 2019-09-19 DIAGNOSIS — R7989 Other specified abnormal findings of blood chemistry: Secondary | ICD-10-CM

## 2019-09-19 DIAGNOSIS — E7800 Pure hypercholesterolemia, unspecified: Secondary | ICD-10-CM | POA: Diagnosis not present

## 2019-09-19 DIAGNOSIS — Z8659 Personal history of other mental and behavioral disorders: Secondary | ICD-10-CM

## 2019-09-19 MED ORDER — LORAZEPAM 1 MG PO TABS: ORAL_TABLET | ORAL | 1 refills | Status: AC

## 2019-09-19 MED ORDER — ERGOCALCIFEROL 1.25 MG (50000 UT) PO CAPS: 50000.0000 [IU] | ORAL_CAPSULE | ORAL | 2 refills | Status: AC

## 2019-09-19 MED ORDER — ROSUVASTATIN CALCIUM 5 MG PO TABS: ORAL_TABLET | ORAL | 3 refills | Status: AC

## 2019-09-19 NOTE — Progress Notes (Signed)
   Subjective:    Patient ID: Kara Blake, female    DOB: 1955/02/18, 64 y.o.   MRN: [id number]  HPI 64 year old Female for 62-month recheck.  Was last seen in February for health maintenance exam.    History of vitamin D deficiency with level in February being 26 and high-dose vitamin D was recommended for 3 months followed by 5000 units daily.  However she prefers high-dose vitamin D and this was refilled.  She will take this weekly.  She currently does not take cholesterol medication.  Recent lipid panel shows a total cholesterol of 263, high HDL of 105 but LDL has increased to 144 from February when it was 109.  Total cholesterol in February was 235.  I have persuaded her to try Crestor 5 mg 3 times a week with supper with follow-up when she returns in February.  History of depression in 2007 treated with Lexapro.  No longer on Lexapro.  Now takes Wellbutrin XL 150 mg daily.  History of anxiety and insomnia.  Prefers Ativan 1 mg taking 1/2 to 1 tablet at bedtime as needed for sleep.    She has a small home downtown here in [place] and spends a good deal of time at her home in [place], [place].  Her husband is an Programme researcher, broadcasting/film/video with [employer] in [employer].  She does not smoke.  Social alcohol consumption.  3 daughters.  Had colonoscopy in 2019 in [facility]  History of adenomatous colon polyps.  Family history of colon cancer.    Review of Systems see above no new complaints     Objective:   Physical Exam Blood pressure 140/80 pulse 98 regular pulse oximetry 96% weight 137 pounds height 5 feet 5 inches BMI 22.80.  Neck is supple without thyromegaly or carotid bruits.  Chest clear.  Cardiac exam regular rate and rhythm.  No lower extremity edema.  Affect and judgment are normal.       Assessment & Plan:  Vitamin D deficiency-she prefers high-dose vitamin D weekly to taking daily vitamin D supplement.  This was refilled.  Elevated total cholesterol of 263 and  LDL cholesterol of 144.  Take Crestor 5 mg 3 times a week and follow-up at time of health maintenance exam in February.  History of anxiety depression treated with Wellbutrin.  History of insomnia treated with Ativan.  Plan: Follow-up at health maintenance exam in February 2022.  Recommend flu vaccine this fall.  She has had 2 Covid vaccines in March and April of this year and will be eligible for booster due to her age around December.

## 2019-09-19 NOTE — Patient Instructions (Addendum)
Begin Crestor 5 mg 3 times a week with supper. RTC in 6 months.  Take high-dose vitamin D weekly.  Watch diet.  Return in 6 months for health maintenance exam.  Take COVID-19 booster.  It was a pleasure to see you today.  Take Ativan for insomnia/anxiety

## 2019-10-18 ENCOUNTER — Other Ambulatory Visit: Payer: Self-pay | Admitting: Internal Medicine

## 2020-03-18 ENCOUNTER — Telehealth: Payer: Self-pay | Admitting: Internal Medicine

## 2020-03-18 NOTE — Telephone Encounter (Signed)
Kara Blake [phone]  Deosha called on 03/17/2020 and to cancel fasting lab appointment for 03/19/2020 and CPE appointment for 03/22/2020, stating she did not want to reschedule because she [place] out of state and has no idea when she will be coming back to [place]. However she does want to keep Dr Renold Genta as her PCP. She also stated she had no idea she had these upcoming appointments, however they were made at her last appointment when she was in office on 09/19/2019.

## 2020-03-19 ENCOUNTER — Other Ambulatory Visit: Payer: No Typology Code available for payment source | Admitting: Internal Medicine

## 2020-03-19 DIAGNOSIS — E7800 Pure hypercholesterolemia, unspecified: Secondary | ICD-10-CM

## 2020-03-19 DIAGNOSIS — Z0000 Encounter for general adult medical examination without abnormal findings: Secondary | ICD-10-CM

## 2020-03-19 DIAGNOSIS — Z1329 Encounter for screening for other suspected endocrine disorder: Secondary | ICD-10-CM

## 2020-03-19 DIAGNOSIS — E559 Vitamin D deficiency, unspecified: Secondary | ICD-10-CM

## 2020-03-19 DIAGNOSIS — Z780 Asymptomatic menopausal state: Secondary | ICD-10-CM

## 2020-03-22 ENCOUNTER — Encounter: Payer: No Typology Code available for payment source | Admitting: Internal Medicine

## 2020-06-01 ENCOUNTER — Telehealth: Payer: Self-pay | Admitting: Internal Medicine

## 2020-06-01 NOTE — Telephone Encounter (Signed)
I agree if travel is an issue that it would be best for her to find a physician there. I am not licensed to practice in [place] and doubt I can order it.

## 2020-06-01 NOTE — Telephone Encounter (Signed)
Magen Suriano [phone]  Odean called she is going to go somewhere in [facility] to get her mammogram and is thinking of getting her Dexa Scan there to and wanted to know if you would send order, I let her know for you to continue to send orders she needs to be current on CPE's and she canceled her last CPE for 03/22/20 and did not want to reschedule last time seen was 09/19/2019, last CPE was 03/20/2019.  She still did not want to reschedule, because she did not know when she wants to make a trip to [facility]. I let her know it may be best if she find a PCP where she is living in [facility], because you have guidelines you have to meet with insurance companies especially Medicare that you have to meet and if her being able to come is an issue, she needs somewhere close to her, and we are now booking CPE's in August.

## 2020-06-01 NOTE — Telephone Encounter (Signed)
LVM, about order, being needed in [facility]

## 2020-08-19 IMAGING — MG DIGITAL SCREENING BILATERAL MAMMOGRAM WITH IMPLANTS, CAD AND TOM
8 of 12 series · 8 of 28 positions shown · non-contrast
Comparison: Previous exam(s).

CLINICAL DATA: Screening.

EXAM:
DIGITAL SCREENING BILATERAL MAMMOGRAM WITH IMPLANTS, CAD AND TOMO
The patient has bilateral retropectoral silicone implants. Standard
and implant displaced views were performed.

[R MLO]
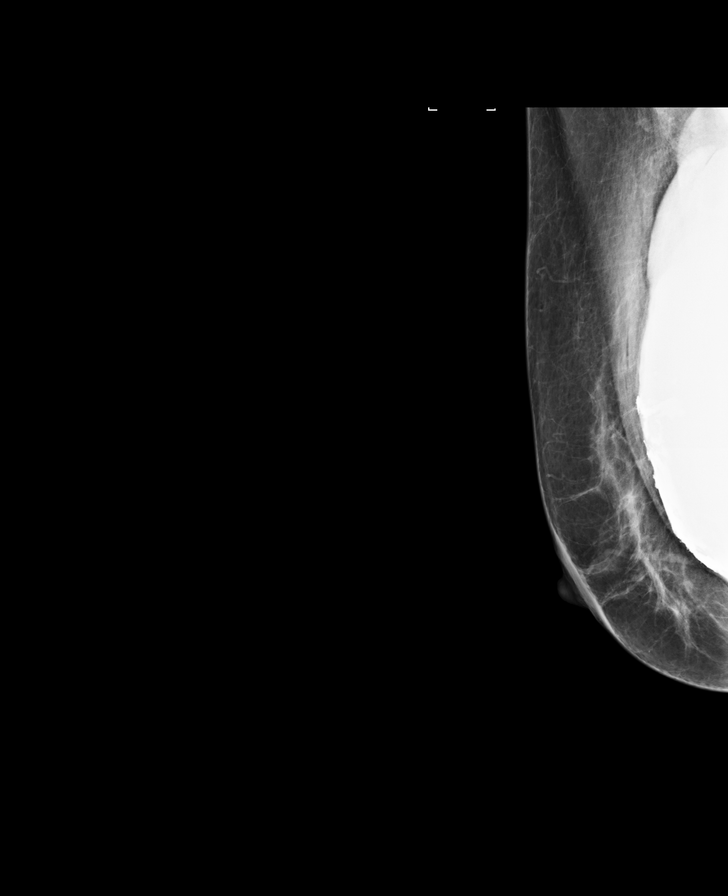

[L MLO]
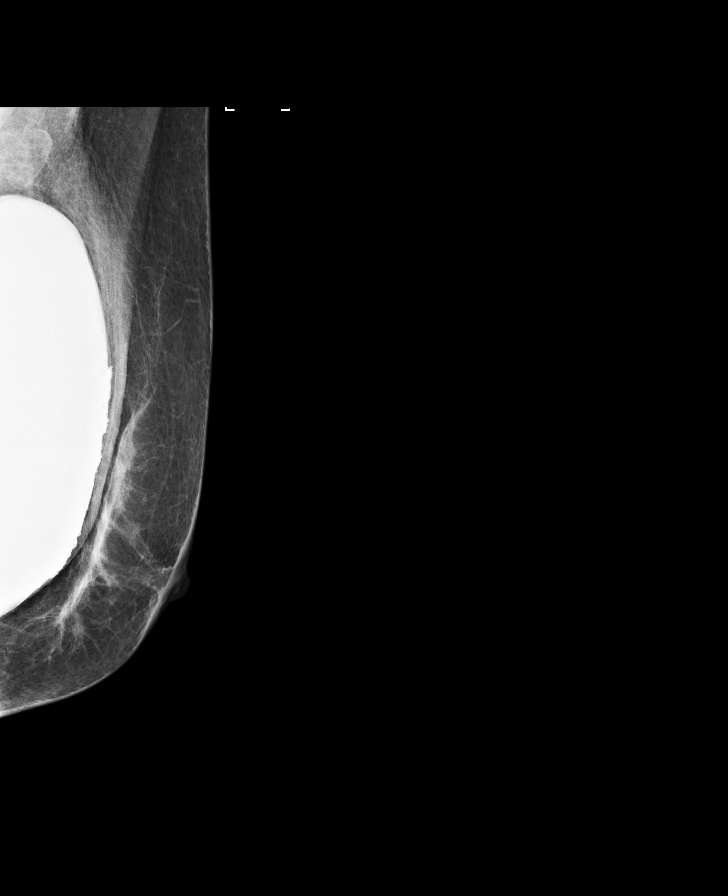

[R CC]
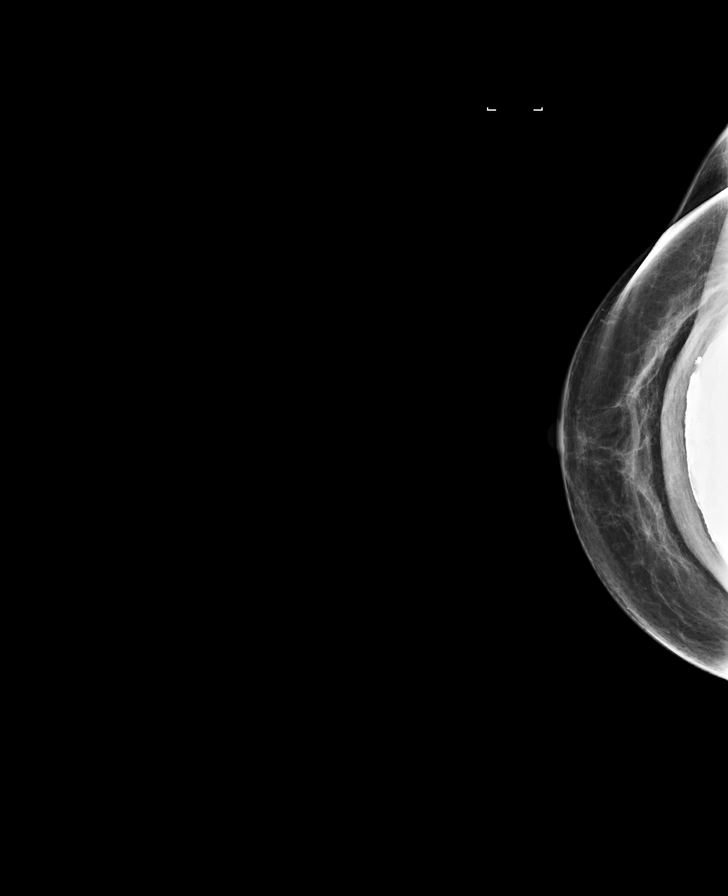

[L CC]
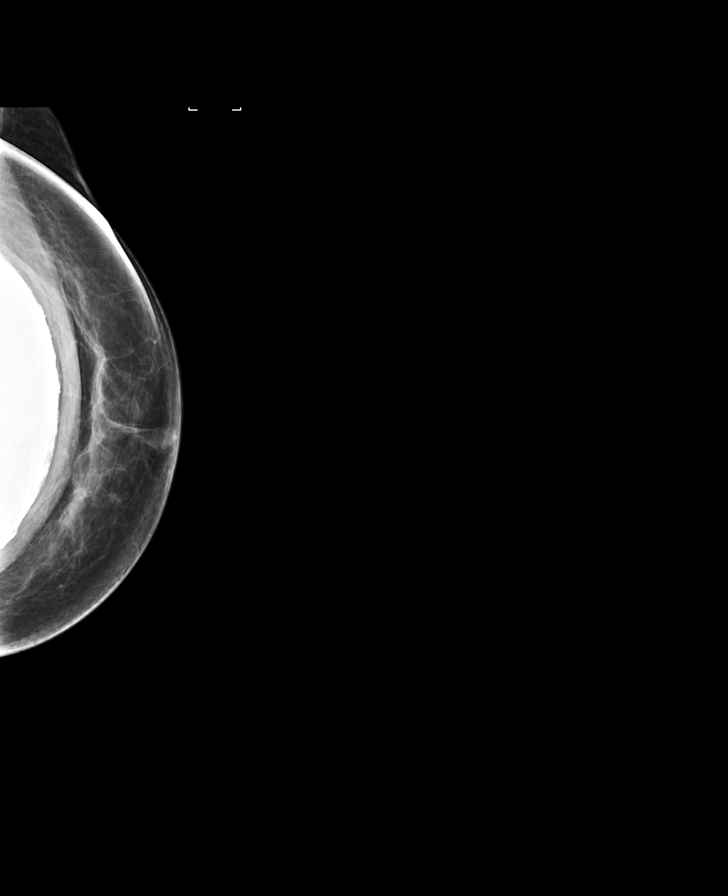

[R MLO synth-2D]
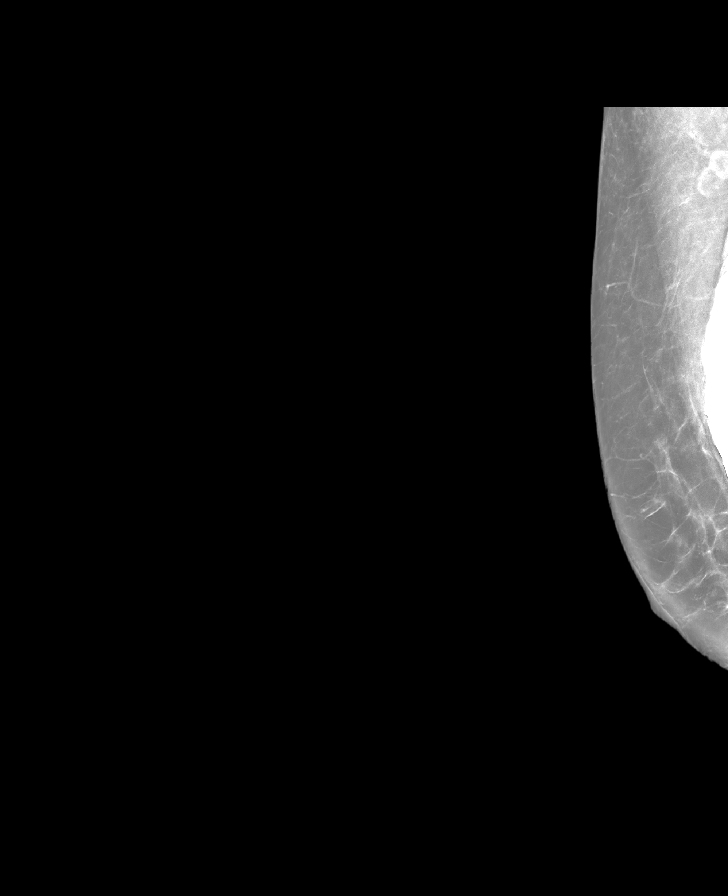

[R CC synth-2D]
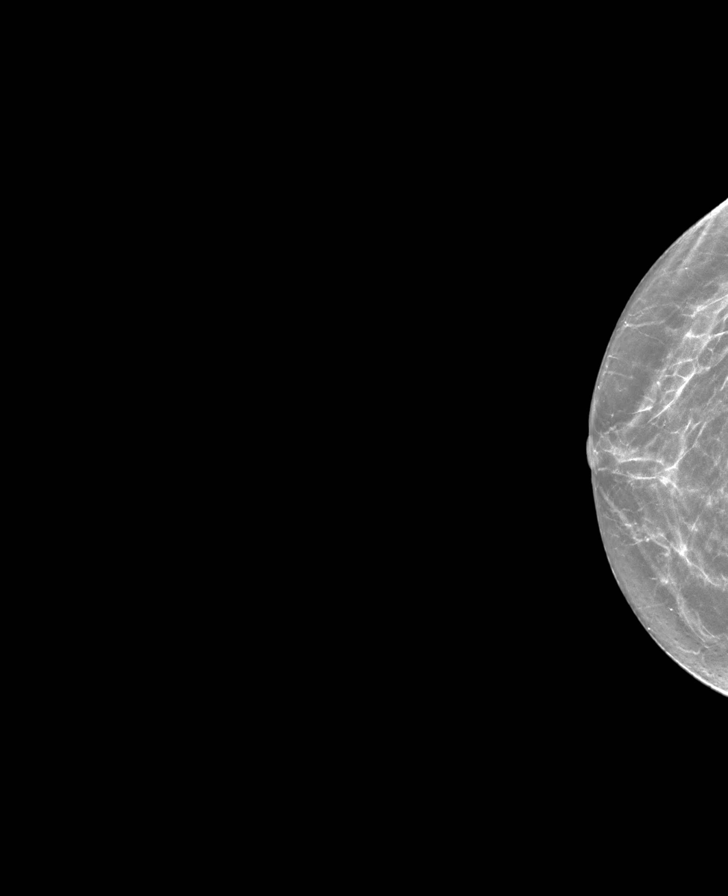

[L MLO synth-2D]
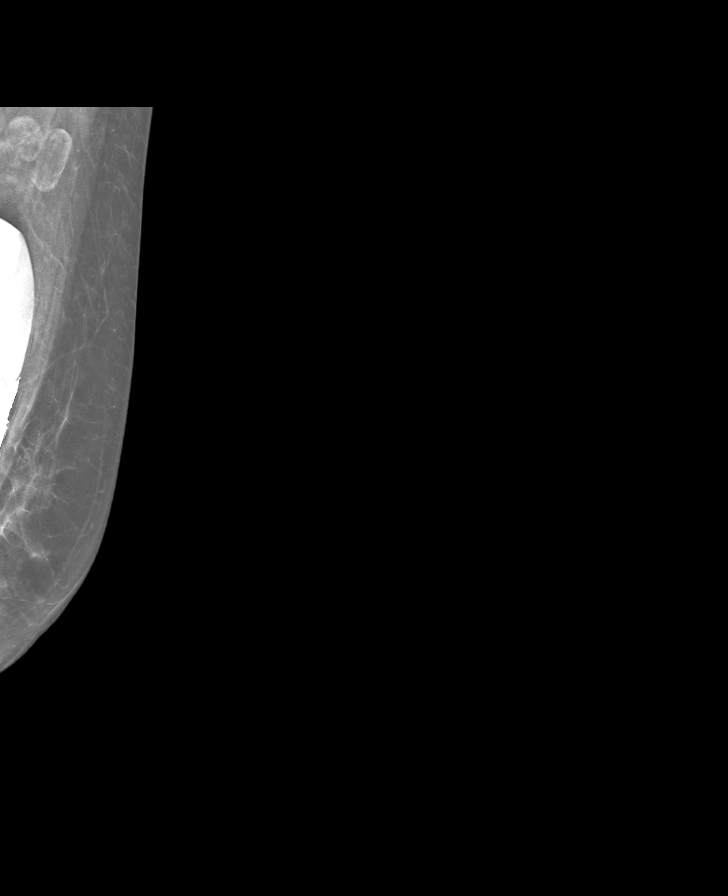

[L CC synth-2D]
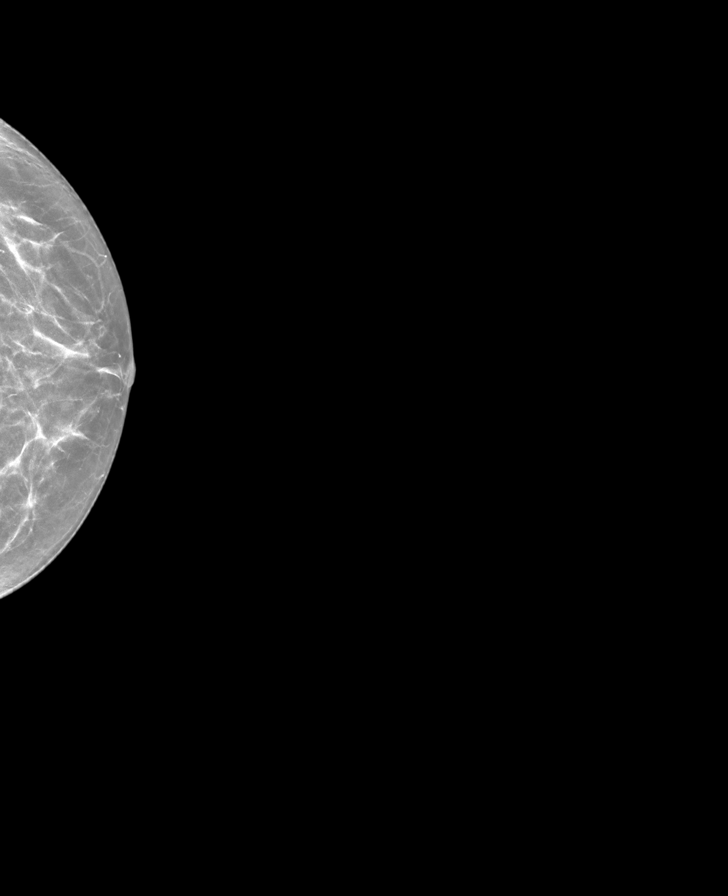

[8 of 28 positions shown; findings below may reference images not displayed]

ACR Breast Density Category b: There are scattered areas of
fibroglandular density.
FINDINGS: There are no findings suspicious for malignancy. Images were
processed with CAD.
IMPRESSION: No mammographic evidence of malignancy. A result letter of this
screening mammogram will be mailed directly to the patient.

RECOMMENDATION:
Screening mammogram in one year. (Code:E6-T-KIJ)

BI-RADS CATEGORY  1:  Negative.
